# Patient Record
Sex: Female | Born: 1995 | Race: Black or African American | Hispanic: No | Marital: Single | Smoking: Never smoker
Health system: Southern US, Community
[De-identification: ages and names within clinical notes are randomized; demographics above are authoritative.]

## PROBLEM LIST (undated history)

## (undated) ENCOUNTER — Inpatient Hospital Stay (HOSPITAL_COMMUNITY): Payer: Self-pay

## (undated) DIAGNOSIS — R569 Unspecified convulsions: Secondary | ICD-10-CM

## (undated) HISTORY — PX: NO PAST SURGERIES: SHX2092

---
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
## 2017-11-24 DIAGNOSIS — H5213 Myopia, bilateral: Secondary | ICD-10-CM | POA: Diagnosis not present

## 2017-11-24 DIAGNOSIS — H52223 Regular astigmatism, bilateral: Secondary | ICD-10-CM | POA: Diagnosis not present

## 2017-12-20 DIAGNOSIS — H52223 Regular astigmatism, bilateral: Secondary | ICD-10-CM | POA: Diagnosis not present

## 2018-02-11 DIAGNOSIS — N771 Vaginitis, vulvitis and vulvovaginitis in diseases classified elsewhere: Secondary | ICD-10-CM | POA: Diagnosis not present

## 2018-02-11 DIAGNOSIS — Z113 Encounter for screening for infections with a predominantly sexual mode of transmission: Secondary | ICD-10-CM | POA: Diagnosis not present

## 2018-03-07 DIAGNOSIS — G8911 Acute pain due to trauma: Secondary | ICD-10-CM | POA: Diagnosis not present

## 2018-03-07 DIAGNOSIS — G501 Atypical facial pain: Secondary | ICD-10-CM | POA: Diagnosis not present

## 2018-03-07 DIAGNOSIS — R079 Chest pain, unspecified: Secondary | ICD-10-CM | POA: Diagnosis not present

## 2018-03-07 DIAGNOSIS — R0789 Other chest pain: Secondary | ICD-10-CM | POA: Diagnosis not present

## 2018-03-07 DIAGNOSIS — Z743 Need for continuous supervision: Secondary | ICD-10-CM | POA: Diagnosis not present

## 2018-07-13 DIAGNOSIS — Z114 Encounter for screening for human immunodeficiency virus [HIV]: Secondary | ICD-10-CM | POA: Diagnosis not present

## 2018-07-13 DIAGNOSIS — Z113 Encounter for screening for infections with a predominantly sexual mode of transmission: Secondary | ICD-10-CM | POA: Diagnosis not present

## 2018-11-27 ENCOUNTER — Emergency Department (HOSPITAL_COMMUNITY): Payer: Medicaid Other

## 2018-11-27 ENCOUNTER — Emergency Department (HOSPITAL_COMMUNITY)
Admission: EM | Admit: 2018-11-27 | Discharge: 2018-11-27 | Disposition: A | Discharge status: Home / Self Care | Payer: Medicaid Other | Attending: Emergency Medicine | Admitting: Emergency Medicine

## 2018-11-27 DIAGNOSIS — R109 Unspecified abdominal pain: Secondary | ICD-10-CM | POA: Diagnosis not present

## 2018-11-27 DIAGNOSIS — M79601 Pain in right arm: Secondary | ICD-10-CM | POA: Diagnosis not present

## 2018-11-27 DIAGNOSIS — Y92410 Unspecified street and highway as the place of occurrence of the external cause: Secondary | ICD-10-CM | POA: Insufficient documentation

## 2018-11-27 DIAGNOSIS — M25521 Pain in right elbow: Secondary | ICD-10-CM | POA: Insufficient documentation

## 2018-11-27 DIAGNOSIS — R1084 Generalized abdominal pain: Secondary | ICD-10-CM | POA: Diagnosis not present

## 2018-11-27 DIAGNOSIS — Y9389 Activity, other specified: Secondary | ICD-10-CM | POA: Insufficient documentation

## 2018-11-27 DIAGNOSIS — Y998 Other external cause status: Secondary | ICD-10-CM | POA: Diagnosis not present

## 2018-11-27 DIAGNOSIS — M25511 Pain in right shoulder: Secondary | ICD-10-CM | POA: Diagnosis not present

## 2018-11-27 DIAGNOSIS — M542 Cervicalgia: Secondary | ICD-10-CM | POA: Insufficient documentation

## 2018-11-27 LAB — I-STAT CREATININE, ED: Creatinine, Ser: 0.6 mg/dL (ref 0.44–1.00)

## 2018-11-27 LAB — I-STAT BETA HCG BLOOD, ED (MC, WL, AP ONLY): I-stat hCG, quantitative: <5 m[IU]/mL (ref <5)

## 2018-11-27 MED ORDER — NAPROXEN 500 MG PO TABS: 500.0000 mg | ORAL_TABLET | Freq: Two times a day (BID) | ORAL | 0 refills | Status: AC

## 2018-11-27 MED ORDER — HYDROCODONE-ACETAMINOPHEN 5-325 MG PO TABS
1.0000 | ORAL_TABLET | Freq: Once | ORAL | Status: AC
  Administered 2018-11-27: 1 via ORAL
  Filled 2018-11-27: qty 1

## 2018-11-27 MED ORDER — IOHEXOL 300 MG/ML  SOLN
100.0000 mL | Freq: Once | INTRAMUSCULAR | Status: AC | PRN
  Administered 2018-11-27: 100 mL via INTRAVENOUS
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
## 2018-11-27 NOTE — ED Notes (Signed)
Patient transported to CT 

## 2018-11-27 NOTE — Discharge Instructions (Addendum)
Please follow up with [facility] for your primary care needs. You may also follow up with OBGYN. Take medication as prescribed for pain.
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
## 2018-12-22 ENCOUNTER — Other Ambulatory Visit: Payer: Self-pay

## 2018-12-22 ENCOUNTER — Ambulatory Visit (INDEPENDENT_AMBULATORY_CARE_PROVIDER_SITE_OTHER): Payer: Medicaid Other | Admitting: Obstetrics

## 2018-12-22 ENCOUNTER — Encounter: Payer: Self-pay | Admitting: Obstetrics

## 2018-12-22 DIAGNOSIS — M25512 Pain in left shoulder: Secondary | ICD-10-CM
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
## 2018-12-23 ENCOUNTER — Ambulatory Visit: Payer: Medicaid Other | Admitting: Family Medicine

## 2019-01-02 ENCOUNTER — Other Ambulatory Visit: Payer: Self-pay

## 2019-01-02 ENCOUNTER — Encounter: Payer: Self-pay | Admitting: Nurse Practitioner

## 2019-01-02 ENCOUNTER — Ambulatory Visit: Payer: No Typology Code available for payment source | Attending: Nurse Practitioner | Admitting: Nurse Practitioner

## 2019-01-02 DIAGNOSIS — Z113 Encounter for screening for infections with a predominantly sexual mode of transmission: Secondary | ICD-10-CM | POA: Diagnosis not present

## 2019-01-02 DIAGNOSIS — Z130 Encounter for screening for diseases of the blood and blood-forming organs and certain disorders involving the immune mechanism: Secondary | ICD-10-CM

## 2019-01-02 DIAGNOSIS — Z7689 Persons encountering health services in other specified circumstances: Secondary | ICD-10-CM

## 2019-01-02 DIAGNOSIS — E871 Hypo-osmolality and hyponatremia: Secondary | ICD-10-CM | POA: Diagnosis not present

## 2019-01-02 NOTE — Progress Notes (Signed)
Virtual Visit via Telephone Note Due to national recommendations of social distancing due to COVID 19, telehealth visit is felt to be most appropriate for this patient at this time.  I discussed the limitations, risks, security and privacy concerns of performing an evaluation and management service by telephone and the availability of in person appointments. I also discussed with the patient that there may be a patient responsible charge related to this service. The patient expressed understanding and agreed to proceed.    I connected with Katie Maxwell on 01/02/19  at   1:50 PM EDT  EDT by telephone and verified that I am speaking with the correct person using two identifiers.   Consent I discussed the limitations, risks, security and privacy concerns of performing an evaluation and management service by telephone and the availability of in person appointments. I also discussed with the patient that there may be a patient responsible charge related to this service. The patient expressed understanding and agreed to proceed.   Location of Patient: Private Residence   [facility]    Persons participating in Telemedicine visit: Bertram DenverZelda  FNP-BC YY Bien CMA Brystol Lajeunesse    History of Present Illness: Telemedicine visit for: Establish care   She is establishing care. She is sexually active.  Has a 21 year old child. Pap smear is overdue. She would also like testing for STD. WIll schedule PAP and blood work. She voices no concerns today.  Although a history of seizures listed for PMH she states she does not recall any seizures from her childhood. There is a hospital admission which notes patient was evaluated for seizure like activity with recommendations by Neurology that patient had syncopal seizure however EEG was never confirmed or completed.  Past Medical History:  Diagnosis Date  . Seizures ([facility])    last seizure in 2015; had  them as a child    Past Surgical History:  Procedure Laterality Date  . NO PAST SURGERIES      Family History  Problem Relation Age of Onset  . Bronchiolitis Mother   . Seizures Maternal Grandmother   . Hypertension Maternal Grandmother   . Seizures Cousin   . Diabetes Father     Social History   Socioeconomic History  . Marital status: Single    Spouse name: Not on file  . Number of children: Not on file  . Years of education: Not on file  . Highest education level: Not on file  Occupational History  . Not on file  Social Needs  . Financial resource strain: Not on file  . Food insecurity    Worry: Not on file    Inability: Not on file  . Transportation needs    Medical: Not on file    Non-medical: Not on file  Tobacco Use  . Smoking status: Never Smoker  . Smokeless tobacco: Never Used  Substance and Sexual Activity  . Alcohol use: No  . Drug use: No  . Sexual activity: Yes    Partners: Male    Birth control/protection: Implant  Lifestyle  . Physical activity    Days per week: Not on file    Minutes per session: Not on file  . Stress: Not on file  Relationships  . Social Musicianconnections    Talks on phone: Not on file    Gets together: Not on file    Attends religious service: Not on file    Active member of club or  organization: Not on file    Attends meetings of clubs or organizations: Not on file    Relationship status: Not on file  Other Topics Concern  . Not on file  Social History Narrative  . Not on file     Observations/Objective: Awake, alert and oriented x 3   Review of Systems  Constitutional: Negative for fever, malaise/fatigue and weight loss.  HENT: Negative.  Negative for nosebleeds.   Eyes: Negative.  Negative for blurred vision, double vision and photophobia.  Respiratory: Negative.  Negative for cough and shortness of breath.   Cardiovascular: Negative.  Negative for chest pain, palpitations and leg swelling.  Gastrointestinal:  Negative.  Negative for heartburn, nausea and vomiting.  Musculoskeletal: Negative.  Negative for myalgias.  Neurological: Negative.  Negative for dizziness, focal weakness, seizures and headaches.  Psychiatric/Behavioral: Negative.  Negative for suicidal ideas.    Assessment and Plan: Diagnoses and all orders for this visit:  Encounter to establish care  Screening for deficiency anemia -     CBC; Future  Hyponatremia -     Basic Metabolic Panel; Future    Follow Up Instructions Return for PAP AND LABS.     I discussed the assessment and treatment plan with the patient. The patient was provided an opportunity to ask questions and all were answered. The patient agreed with the plan and demonstrated an understanding of the instructions.   The patient was advised to call back or seek an in-person evaluation if the symptoms worsen or if the condition fails to improve as anticipated.  I provided 18 minutes of non-face-to-face time during this encounter including median intraservice time, reviewing previous notes, labs, imaging, medications and explaining diagnosis and management.  Gildardo Pounds, FNP-BC

## 2019-01-03 ENCOUNTER — Encounter: Payer: Self-pay | Admitting: Nurse Practitioner

## 2019-01-16 ENCOUNTER — Other Ambulatory Visit: Payer: Medicaid Other | Admitting: Nurse Practitioner

## 2019-02-03 DIAGNOSIS — Z20828 Contact with and (suspected) exposure to other viral communicable diseases: Secondary | ICD-10-CM | POA: Diagnosis not present

## 2019-02-13 ENCOUNTER — Encounter

## 2019-06-27 ENCOUNTER — Ambulatory Visit: Payer: Medicaid Other | Admitting: Obstetrics and Gynecology

## 2019-06-27 ENCOUNTER — Other Ambulatory Visit: Payer: Self-pay

## 2019-06-27 ENCOUNTER — Other Ambulatory Visit (HOSPITAL_COMMUNITY)
Admission: RE | Admit: 2019-06-27 | Discharge: 2019-06-27 | Disposition: A | Discharge status: Home / Self Care | Payer: Medicaid Other | Source: Ambulatory Visit | Attending: Obstetrics and Gynecology | Admitting: Obstetrics and Gynecology

## 2019-06-27 ENCOUNTER — Encounter: Payer: Self-pay | Admitting: Obstetrics and Gynecology

## 2019-06-27 VITALS — BP 114/75 | HR 93 | Wt 100.0 lb

## 2019-06-27 DIAGNOSIS — Z113 Encounter for screening for infections with a predominantly sexual mode of transmission: Secondary | ICD-10-CM | POA: Insufficient documentation

## 2019-06-27 DIAGNOSIS — Z30013 Encounter for initial prescription of injectable contraceptive: Secondary | ICD-10-CM | POA: Diagnosis not present

## 2019-06-27 DIAGNOSIS — Z3046 Encounter for surveillance of implantable subdermal contraceptive: Secondary | ICD-10-CM | POA: Diagnosis not present

## 2019-06-27 MED ORDER — MEDROXYPROGESTERONE ACETATE 150 MG/ML IM SUSP
150.0000 mg | Freq: Once | INTRAMUSCULAR | Status: AC
  Administered 2019-06-27: 150 mg via INTRAMUSCULAR

## 2019-06-27 MED ORDER — MEDROXYPROGESTERONE ACETATE 150 MG/ML IM SUSP: 150.0000 mg | INTRAMUSCULAR | 5 refills | Status: DC

## 2019-06-27 NOTE — Progress Notes (Addendum)
Depo injection given at today's visit. Pt received office supplied Depo today. Pt tolerated injection well. Pt advised to RTO 3/30-4/13/2021 for next depo.  Administrations This Visit    medroxyPROGESTERone (DEPO-PROVERA) injection 150 mg    Admin Date 06/27/2019 Action Given Dose 150 mg Route Intramuscular Administered By Lanney Gins, CMA

## 2019-06-27 NOTE — Progress Notes (Signed)
24 yo P1 here for Nexplanon removal (in place since 05/2016) with irregular bleeding. Contraception options reviewed and patient opted for depo-provera   Removal Patient given informed consent for removal of her Implanon, time out was performed.  Signed copy in the chart.  Appropriate time out taken. Nexplanon site identified.  Area prepped in usual sterile fashon. One cc of 1% lidocaine was used to anesthetize the area at the distal end of the implant. A small stab incision was made right beside the implant on the distal portion.  The Nexplanon rod was grasped using hemostats and removed without difficulty.  There was less than 3 cc blood loss. There were no complications.  A small amount of antibiotic ointment and steri-strips were applied over the small incision.  A pressure bandage was applied to reduce any bruising.  The patient tolerated the procedure well and was given post procedure instructions.  Patient will return for annual exam with pap smear

## 2019-06-27 NOTE — Progress Notes (Signed)
Pt states she has prolong irregular bleeding with Nexplanon and would like to switch methods. Pt unsure what method she would like to try.

## 2019-06-27 NOTE — Addendum Note (Signed)
Addended by: Marya Landry D on: 06/27/2019 04:01 PM   Modules accepted: Orders

## 2019-06-28 LAB — CERVICOVAGINAL ANCILLARY ONLY
Chlamydia: NEGATIVE
Comment: NEGATIVE
Comment: NORMAL
Neisseria Gonorrhea: NEGATIVE

## 2019-06-28 LAB — HIV ANTIBODY (ROUTINE TESTING W REFLEX): HIV Screen 4th Generation wRfx: NONREACTIVE

## 2019-06-28 LAB — RPR: RPR Ser Ql: NONREACTIVE

## 2019-06-28 LAB — HEPATITIS B SURFACE ANTIGEN: Hepatitis B Surface Ag: NEGATIVE

## 2019-06-28 LAB — HEPATITIS C ANTIBODY: Hep C Virus Ab: 0.1 s/co ratio (ref 0.0–0.9)

## 2019-07-11 DIAGNOSIS — Z20828 Contact with and (suspected) exposure to other viral communicable diseases: Secondary | ICD-10-CM | POA: Diagnosis not present

## 2019-09-29 ENCOUNTER — Other Ambulatory Visit: Payer: Self-pay

## 2019-09-29 ENCOUNTER — Ambulatory Visit (INDEPENDENT_AMBULATORY_CARE_PROVIDER_SITE_OTHER): Payer: Medicaid Other

## 2019-09-29 DIAGNOSIS — Z3042 Encounter for surveillance of injectable contraceptive: Secondary | ICD-10-CM | POA: Diagnosis not present

## 2019-09-29 MED ORDER — MEDROXYPROGESTERONE ACETATE 150 MG/ML IM SUSP
150.0000 mg | Freq: Once | INTRAMUSCULAR | Status: AC
  Administered 2019-09-29: 150 mg via INTRAMUSCULAR

## 2019-09-29 NOTE — Progress Notes (Signed)
Patient seen and assessed by nursing staff during this encounter. I have reviewed the chart and agree with the documentation and plan. I have also made any necessary editorial changes.  Shaune Malacara, MD 09/29/2019 11:14 AM    

## 2019-09-29 NOTE — Progress Notes (Signed)
Katie Maxwell is here for depo. Pt tolerated injection well in the LD without complication.  -EH/RMA

## 2019-12-19 ENCOUNTER — Other Ambulatory Visit: Payer: Self-pay

## 2019-12-19 ENCOUNTER — Encounter: Payer: Self-pay | Admitting: Obstetrics

## 2019-12-19 ENCOUNTER — Ambulatory Visit (INDEPENDENT_AMBULATORY_CARE_PROVIDER_SITE_OTHER): Payer: Medicaid Other

## 2019-12-19 VITALS — BP 102/68 | HR 75 | Wt 105.0 lb

## 2019-12-19 DIAGNOSIS — Z3042 Encounter for surveillance of injectable contraceptive: Secondary | ICD-10-CM

## 2019-12-19 MED ORDER — MEDROXYPROGESTERONE ACETATE 150 MG/ML IM SUSP
150.0000 mg | Freq: Once | INTRAMUSCULAR | Status: AC
  Administered 2019-12-19: 150 mg via INTRAMUSCULAR

## 2019-12-19 NOTE — Progress Notes (Signed)
Presents for DEPO, given in RD, tolerated well.  Next DEPO Sept. 21 - Oct. 5, 2021  Administrations This Visit    medroxyPROGESTERone (DEPO-PROVERA) injection 150 mg    Admin Date 12/19/2019 Action Given Dose 150 mg Route Intramuscular Administered By Maretta Bees, RMA

## 2019-12-19 NOTE — Progress Notes (Signed)
Chart reviewed for nurse visit. Agree with plan of care.   Nakhi Choi L, DO 12/19/2019 2:45 PM

## 2020-03-05 ENCOUNTER — Encounter: Payer: Self-pay | Admitting: Obstetrics

## 2020-03-05 ENCOUNTER — Other Ambulatory Visit (HOSPITAL_COMMUNITY)
Admission: RE | Admit: 2020-03-05 | Discharge: 2020-03-05 | Disposition: A | Discharge status: Home / Self Care | Payer: BC Managed Care – PPO | Source: Ambulatory Visit | Attending: Obstetrics and Gynecology | Admitting: Obstetrics and Gynecology

## 2020-03-05 ENCOUNTER — Ambulatory Visit (INDEPENDENT_AMBULATORY_CARE_PROVIDER_SITE_OTHER): Payer: Medicaid Other | Admitting: *Deleted

## 2020-03-05 ENCOUNTER — Other Ambulatory Visit: Payer: Self-pay

## 2020-03-05 VITALS — BP 110/75 | HR 73

## 2020-03-05 DIAGNOSIS — Z113 Encounter for screening for infections with a predominantly sexual mode of transmission: Secondary | ICD-10-CM | POA: Diagnosis not present

## 2020-03-05 DIAGNOSIS — Z3042 Encounter for surveillance of injectable contraceptive: Secondary | ICD-10-CM

## 2020-03-05 DIAGNOSIS — N939 Abnormal uterine and vaginal bleeding, unspecified: Secondary | ICD-10-CM | POA: Diagnosis present

## 2020-03-05 MED ORDER — MEDROXYPROGESTERONE ACETATE 150 MG/ML IM SUSP
150.0000 mg | Freq: Once | INTRAMUSCULAR | Status: AC
  Administered 2020-03-05: 150 mg via INTRAMUSCULAR

## 2020-03-05 NOTE — Progress Notes (Signed)
Pt is in office for Depo injection. Pt is on time for injection.  Depo given, pt tolerated well.  Advised to RTO 12/7-21 for next Depo.   Pt states she has had some irregular spotting the last 1-2 weeks.  Pt request to have STD screen today. Pt request full panel swab. Self swab performed and sent to lab today.  Pt will be made aware of any abnormal results.  BP 110/75   Pulse 73   Administrations This Visit    medroxyPROGESTERone (DEPO-PROVERA) injection 150 mg    Admin Date 03/05/2020 Action Given Dose 150 mg Route Intramuscular Administered By Lanney Gins, CMA

## 2020-03-06 NOTE — Progress Notes (Signed)
Patient was assessed and managed by nursing staff during this encounter. I have reviewed the chart and agree with the documentation and plan. I have also made any necessary editorial changes.  Ronnica Dreese, MD 03/06/2020 10:56 AM    

## 2020-03-07 LAB — CERVICOVAGINAL ANCILLARY ONLY
Bacterial Vaginitis (gardnerella): POSITIVE — AB
Candida Glabrata: NEGATIVE
Candida Vaginitis: NEGATIVE
Chlamydia: NEGATIVE
Comment: NEGATIVE
Comment: NEGATIVE
Comment: NEGATIVE
Comment: NEGATIVE
Comment: NEGATIVE
Comment: NORMAL
Neisseria Gonorrhea: NEGATIVE
Trichomonas: NEGATIVE

## 2020-03-07 MED ORDER — METRONIDAZOLE 500 MG PO TABS: 500.0000 mg | ORAL_TABLET | Freq: Two times a day (BID) | ORAL | 0 refills | Status: AC

## 2020-03-07 NOTE — Addendum Note (Signed)
Addended by: Catalina Antigua on: 03/07/2020 04:07 PM   Modules accepted: Orders

## 2020-05-28 ENCOUNTER — Ambulatory Visit: Payer: Medicaid Other

## 2020-06-24 DIAGNOSIS — M25531 Pain in right wrist: Secondary | ICD-10-CM | POA: Diagnosis not present

## 2020-07-29 ENCOUNTER — Other Ambulatory Visit: Payer: Self-pay

## 2020-07-29 ENCOUNTER — Encounter: Payer: Self-pay | Admitting: Advanced Practice Midwife

## 2020-07-29 ENCOUNTER — Ambulatory Visit (INDEPENDENT_AMBULATORY_CARE_PROVIDER_SITE_OTHER): Payer: Medicaid Other | Admitting: Advanced Practice Midwife

## 2020-07-29 ENCOUNTER — Other Ambulatory Visit (HOSPITAL_COMMUNITY)
Admission: RE | Admit: 2020-07-29 | Discharge: 2020-07-29 | Disposition: A | Discharge status: Home / Self Care | Payer: Medicaid Other | Source: Ambulatory Visit | Attending: Advanced Practice Midwife | Admitting: Advanced Practice Midwife

## 2020-07-29 VITALS — BP 117/63 | HR 83 | Ht 59.0 in | Wt 105.0 lb

## 2020-07-29 DIAGNOSIS — Z113 Encounter for screening for infections with a predominantly sexual mode of transmission: Secondary | ICD-10-CM | POA: Insufficient documentation

## 2020-07-29 DIAGNOSIS — R109 Unspecified abdominal pain: Secondary | ICD-10-CM | POA: Diagnosis not present

## 2020-07-29 DIAGNOSIS — Z01419 Encounter for gynecological examination (general) (routine) without abnormal findings: Secondary | ICD-10-CM

## 2020-07-29 DIAGNOSIS — Z124 Encounter for screening for malignant neoplasm of cervix: Secondary | ICD-10-CM

## 2020-07-29 DIAGNOSIS — N939 Abnormal uterine and vaginal bleeding, unspecified: Secondary | ICD-10-CM

## 2020-07-29 DIAGNOSIS — N61 Mastitis without abscess: Secondary | ICD-10-CM | POA: Diagnosis not present

## 2020-07-29 DIAGNOSIS — Z3009 Encounter for other general counseling and advice on contraception: Secondary | ICD-10-CM

## 2020-07-29 DIAGNOSIS — R102 Pelvic and perineal pain: Secondary | ICD-10-CM | POA: Diagnosis not present

## 2020-07-29 DIAGNOSIS — S21039A Puncture wound without foreign body of unspecified breast, initial encounter: Secondary | ICD-10-CM

## 2020-07-29 DIAGNOSIS — Z3042 Encounter for surveillance of injectable contraceptive: Secondary | ICD-10-CM | POA: Insufficient documentation

## 2020-07-29 MED ORDER — XULANE 150-35 MCG/24HR TD PTWK: 1.0000 | MEDICATED_PATCH | TRANSDERMAL | 12 refills | Status: AC

## 2020-07-29 MED ORDER — SULFAMETHOXAZOLE-TRIMETHOPRIM 800-160 MG PO TABS: 1.0000 | ORAL_TABLET | Freq: Two times a day (BID) | ORAL | 0 refills | Status: AC

## 2020-07-29 NOTE — Progress Notes (Signed)
Subjective:     Katie Maxwell is a 25 y.o. female here at [facility] for a routine exam.  Current complaints: None.  Personal health questionnaire reviewed: yes.  Do you have a primary care provider? yds Do you feel safe at home? yes  Flowsheet Row Office Visit from 07/29/2020 in [facility]  PHQ-2 Total Score 0      Risk factors for chronic health problems: Smoking: Never Alchohol/how much: None Pt BMI: Body mass index is 21.21 kg/m.   Gynecologic History No LMP recorded (lmp unknown). (Menstrual status: Other). Contraception: condoms Last Pap: No hx.  Last mammogram: n/a.   Obstetric History OB History  Gravida Para Term Preterm AB Living  1 1 0 1 0 1  SAB IAB Ectopic Multiple Live Births  0 0 0 0 1    # Outcome Date GA Lbr Len/2nd Weight Sex Delivery Anes PTL Lv  1 Preterm 06/06/16 [redacted]w[redacted]d 05:30 / 00:04 5 lb 9.2 oz (2.53 kg) M Vag-Spont None  LIV     The following portions of the patient's history were reviewed and updated as appropriate: allergies, current medications, past family history, past medical history, past social history, past surgical history and problem list.  Review of Systems Pertinent items noted in HPI and remainder of comprehensive ROS otherwise negative.    Objective:   BP 117/63   Pulse 83   Ht 4\' 11"  (1.499 m)   Wt 105 lb (47.6 kg)   LMP  (LMP Unknown)   BMI 21.21 kg/m  VS reviewed, nursing note reviewed,  Constitutional: well developed, well nourished, no distress HEENT: normocephalic CV: normal rate Pulm/chest wall: normal effort Breast Exam:  Deferred with low risks and shared decision making, discussed recommendation to start mammogram between 40-50 yo/ exam Abdomen: soft Neuro: alert and oriented x 3 Skin: warm, dry Psych: affect normal Pelvic exam: Performed: Cervix pink, visually closed, without lesion, scant white creamy discharge, vaginal walls and external genitalia normal Bimanual exam: Cervix  0/long/high, firm, anterior, neg CMT, uterus nontender, nonenlarged, adnexa without tenderness, enlargement, or mass       Assessment/Plan:   1. Screen for STD (sexually transmitted disease)  - Cervicovaginal ancillary only( [facility]) - RPR+HBsAg+HCVAb+...  2. Screening for cervical cancer  - Cytology - PAP( [facility])  3. Well woman exam   4. Abdominal pain in female --See pelvic pain  5. Abnormal uterine bleeding (AUB) --Pt with hx irregular menses prior to contraception.  Last Depo injection in October, only 1 menses, light spotting only since stopping Depo. - November PELVIC COMPLETE WITH TRANSVAGINAL; Future  6. Acute pelvic pain, female --Pain in lower abdomen and pelvis x 1 month, but hx of milder pain x 1-2 years.  Pt with hx abnormal/irregular menses. - US PELVIC COMPLETE WITH TRANSVAGINAL; Future  7. Pierced nipple infection --Left nipple with edema, errythema of nipple, areola wnl, no fever/chills - sulfamethoxazole-trimethoprim (BACTRIM DS) 800-160 MG tablet; Take 1 tablet by mouth 2 (two) times daily for 7 days.  Dispense: 14 tablet; Refill: 0  8. Encounter for general counseling and advice on contraceptive management Discussed LARCs as most effective forms of birth control.  Discussed benefits/risks of other methods.  Pt has hx irregular menses and desires regular period. Cycles may help pt pain so pt desires to try.  Discussed failure rates of OCPs with regular use. Reviewed risk factors and s/sx of DVT/PE/reasons to seek medical care.   --F/U in 3 months - norelgestromin-ethinyl  estradiol [employer]) 150-35 MCG/24HR transdermal patch; Place 1 patch onto the skin once a week.  Dispense: 3 patch; Refill: 12  Follow up in: 3 months or as needed.   Sharen Counter, CNM 2:07 PM

## 2020-07-29 NOTE — Patient Instructions (Signed)
Pelvic Pain, Female Pelvic pain is pain in your lower abdomen, below your belly button and between your hips. The pain may start suddenly (be acute), keep coming back (be recurring), or last a long time (become chronic). Pelvic pain that lasts longer than 6 months is considered chronic. Pelvic pain may affect your:  Reproductive organs.  Urinary system.  Digestive tract.  Musculoskeletal system. There are many potential causes of pelvic pain. Sometimes, the pain can be a result of digestive or urinary conditions, strained muscles or ligaments, or reproductive conditions. Sometimes the cause of pelvic pain is not known. Follow these instructions at home:  Take over-the-counter and prescription medicines only as told by your health care provider.  Rest as told by your health care provider.  Do not have sex if it hurts.  Keep a journal of your pelvic pain. Write down: ? When the pain started. ? Where the pain is located. ? What seems to make the pain better or worse, such as food or your period (menstrual cycle). ? Any symptoms you have along with the pain.  Keep all follow-up visits as told by your health care provider. This is important.   Contact a health care provider if:  Medicine does not help your pain.  Your pain comes back.  You have new symptoms.  You have abnormal vaginal discharge or bleeding, including bleeding after menopause.  You have a fever or chills.  You are constipated.  You have blood in your urine or stool.  You have foul-smelling urine.  You feel weak or light-headed. Get help right away if:  You have sudden severe pain.  Your pain gets steadily worse.  You have severe pain along with fever, nausea, vomiting, or excessive sweating.  You lose consciousness. Summary  Pelvic pain is pain in your lower abdomen, below your belly button and between your hips.  There are many potential causes of pelvic pain.  Keep a journal of your pelvic  pain. This information is not intended to replace advice given to you by your health care provider. Make sure you discuss any questions you have with your health care provider. Document Revised: 11/17/2017 Document Reviewed: 11/17/2017 Elsevier Patient Education  2021 Elsevier Inc.  Hormonal Contraception Information Hormonal contraception is a type of birth control that uses hormones to prevent pregnancy. It usually involves a combination of the hormones estrogen and progesterone, or only the hormone progesterone. Hormonal contraception works in these ways:  It thickens the mucus in the cervix, which is the lowest part of the uterus. Thicker mucus makes it harder for sperm to enter the uterus.  It changes the lining of the uterus. This makes it harder for an egg to attach or implant.  It may stop the ovaries from releasing eggs (ovulation). Some women who take hormonal contraceptives that contain only progesterone may continue to ovulate. Hormonal contraception cannot prevent STIs (sexually transmitted infections). Pregnancy may still occur. Types of hormonal contraception Estrogen and progesterone contraceptives Contraceptives that use a combination of estrogen and progesterone are available in these forms:  Pill. Pills come in different combinations of hormones. Pills must be taken at the same time each day. They can affect your period. You can get your period monthly, once every 3 months, or not at all.  Patch. The patch is applied to the buttocks, abdomen, upper outer arm, or back. It is kept in place for 3 weeks. It is removed for the last or fourth week of the cycle.  Vaginal  ring. The ring is placed in the vagina and left there for 3 weeks. It is then removed for the last or fourth week of the cycle.   Progesterone-only contraceptives Contraceptives that use only progesterone are available in these forms:  Pill. Pills should be taken at the same time everyday. This is very  important to decrease the chance of pregnancy. Pills containing progestin-only are usually taken every day of the cycle. Other types of pills may have a placebo tablet for the last 4 days of every cycle.  Intrauterine device (IUD). This device is inserted through the vagina and cervix into the uterus. It is removed or replaced every 3 to 5 years, depending on the type. It can be removed sooner.  Implant. Plastic rods are placed under the skin of the upper arm. They are removed or replaced every 3 years. They can be removed sooner.  Shot (injection). The injection is given once every 12 or 13 weeks (about 3 months).   Risks associated with hormonal contraception Estrogen and progesterone contraceptives can sometimes cause side effects, such as:  Nausea.  Headaches.  Breast tenderness.  Bleeding or spotting between menstrual cycles.  High blood pressure (rare).  Strokes, heart attacks, or blood clots (rare). Progesterone-only contraceptives also can have side effects, such as:  Nausea.  Headaches.  Breast tenderness.  Irregular menstrual bleeding.  High blood pressure (rare). Talk to your health care provider about what side effects may mean for you. Questions to ask:  What type of hormonal contraception is right for me?  How long should I plan to use hormonal contraception?  What are the side effects of the hormonal contraception method I choose?  How can I prevent STIs while using hormonal contraception? Where to find more information Ask your health care provider for more information and resources about hormonal contraception. You can also go to:  [place] Department of Health and [employer], Office on Women's Health: http://hoffman.com/ Summary  Estrogen and progesterone are hormones used in many forms of birth control.  Hormonal contraception cannot prevent STIs (sexually transmitted infections).  Talk to your health care provider about what side effects may  mean for you.  Ask your health care provider for more information and resources about hormonal contraception. This information is not intended to replace advice given to you by your health care provider. Make sure you discuss any questions you have with your health care provider. Document Revised: 02/07/2020 Document Reviewed: 02/07/2020 Elsevier Patient Education  2021 [employer].

## 2020-07-29 NOTE — Progress Notes (Signed)
GYN presents for AEX/STD screening.  C/o abdominal pain 6/10 x 1+ month.

## 2020-07-30 LAB — CERVICOVAGINAL ANCILLARY ONLY
Chlamydia: NEGATIVE
Comment: NEGATIVE
Comment: NEGATIVE
Comment: NORMAL
Neisseria Gonorrhea: NEGATIVE
Trichomonas: NEGATIVE

## 2020-07-30 LAB — RPR+HBSAG+HCVAB+...
HIV Screen 4th Generation wRfx: NONREACTIVE
Hep C Virus Ab: 0.1 s/co ratio (ref 0.0–0.9)
Hepatitis B Surface Ag: NEGATIVE
RPR Ser Ql: NONREACTIVE

## 2020-08-02 LAB — CYTOLOGY - PAP
Comment: NEGATIVE
Diagnosis: UNDETERMINED — AB
High risk HPV: NEGATIVE

## 2020-08-05 ENCOUNTER — Encounter: Payer: Self-pay | Admitting: Advanced Practice Midwife

## 2020-08-05 DIAGNOSIS — R87610 Atypical squamous cells of undetermined significance on cytologic smear of cervix (ASC-US): Secondary | ICD-10-CM | POA: Insufficient documentation

## 2020-08-08 ENCOUNTER — Ambulatory Visit
Admission: RE | Admit: 2020-08-08 | Discharge: 2020-08-08 | Disposition: A | Discharge status: Home / Self Care | Payer: Medicaid Other | Source: Ambulatory Visit | Attending: Advanced Practice Midwife | Admitting: Advanced Practice Midwife

## 2020-08-08 ENCOUNTER — Other Ambulatory Visit: Payer: Self-pay

## 2020-08-08 DIAGNOSIS — R102 Pelvic and perineal pain: Secondary | ICD-10-CM | POA: Insufficient documentation

## 2020-08-08 DIAGNOSIS — D259 Leiomyoma of uterus, unspecified: Secondary | ICD-10-CM | POA: Diagnosis not present

## 2020-08-08 DIAGNOSIS — R188 Other ascites: Secondary | ICD-10-CM | POA: Diagnosis not present

## 2020-08-08 DIAGNOSIS — N939 Abnormal uterine and vaginal bleeding, unspecified: Secondary | ICD-10-CM | POA: Diagnosis not present

## 2020-10-24 ENCOUNTER — Emergency Department (HOSPITAL_COMMUNITY)
Admission: EM | Admit: 2020-10-24 | Discharge: 2020-10-25 | Disposition: A | Discharge status: Home / Self Care | Payer: BC Managed Care – PPO | Attending: Emergency Medicine | Admitting: Emergency Medicine

## 2020-10-24 ENCOUNTER — Other Ambulatory Visit: Payer: Self-pay

## 2020-10-24 DIAGNOSIS — N939 Abnormal uterine and vaginal bleeding, unspecified: Secondary | ICD-10-CM | POA: Diagnosis not present

## 2020-10-24 DIAGNOSIS — Z5321 Procedure and treatment not carried out due to patient leaving prior to being seen by health care provider: Secondary | ICD-10-CM | POA: Insufficient documentation

## 2020-10-24 NOTE — ED Provider Notes (Signed)
  Emergency Medicine Provider in Triage Note   MSE was initiated and I personally evaluated the patient  11:28 PM on Oct 24, 2020 as provider in triage.   Chief Complaint: Vaginal bleeding  HPI  Patient is a 25 y.o. who presents to the ED with complaints of vaginal bleeding x 3-4 days. Patient reports waxing/waning vaginal bleeding, passing clots and mucous at times which she states is atypical for her, having associated lower abdominal cramps. Hx of irregular periods. Sexually active w/ 1 partner w/o protection or OCP.    Review of Systems  Positive: Vaginal bleeding, pelvic cramping Negative: Vomiting, dysuria, syncope  Physical Exam  BP 115/67 (BP Location: Right Arm)   Pulse 78   Temp 98.6 F (37 C) (Oral)   Resp 16   SpO2 100%    Gen:   Awake, no distress   HEENT:  Atraumatic  Resp:  Normal effort  Cardiac:  Normal rate  Abd:   Nondistended, nontender  MSK:   Moves extremities without difficulty  Neuro:  Speech clear   Medical Decision Making   Initiation of care has begun. The patient has been counseled on the process, plan, and necessity for staying for the completion/evaluation, informed that the remainder of the evaluation will be completed by another provider, this initial triage assessment does not replace that evaluation, and the importance of remaining in the ED until their evaluation is complete.  Clinical Impression  Vaginal bleeding.         Desmond Lope 10/24/20 2330    Nira Conn, MD 10/28/20 2106

## 2020-10-24 NOTE — ED Triage Notes (Signed)
Pt having irregular vaginal bleeding. Blood has mucous within blood. Has abdominal cramping x 4 days. Pt denies nausea/vomiting.

## 2020-10-25 LAB — CBC WITH DIFFERENTIAL/PLATELET
Abs Immature Granulocytes: 0.01 10*3/uL (ref 0.00–0.07)
Basophils Absolute: 0 10*3/uL (ref 0.0–0.1)
Basophils Relative: 0 %
Eosinophils Absolute: 0.2 10*3/uL (ref 0.0–0.5)
Eosinophils Relative: 5 %
HCT: 37.3 % (ref 36.0–46.0)
Hemoglobin: 12.4 g/dL (ref 12.0–15.0)
Immature Granulocytes: 0 %
Lymphocytes Relative: 51 %
Lymphs Abs: 1.9 10*3/uL (ref 0.7–4.0)
MCH: 29.9 pg (ref 26.0–34.0)
MCHC: 33.2 g/dL (ref 30.0–36.0)
MCV: 89.9 fL (ref 80.0–100.0)
Monocytes Absolute: 0.3 10*3/uL (ref 0.1–1.0)
Monocytes Relative: 7 %
Neutro Abs: 1.4 10*3/uL — ABNORMAL LOW (ref 1.7–7.7)
Neutrophils Relative %: 37 %
Platelets: 192 10*3/uL (ref 150–400)
RBC: 4.15 MIL/uL (ref 3.87–5.11)
RDW: 11.4 % — ABNORMAL LOW (ref 11.5–15.5)
WBC: 3.8 10*3/uL — ABNORMAL LOW (ref 4.0–10.5)
nRBC: 0 % (ref 0.0–0.2)

## 2020-10-25 LAB — URINALYSIS, ROUTINE W REFLEX MICROSCOPIC
Bilirubin Urine: NEGATIVE
Glucose, UA: NEGATIVE mg/dL
Hgb urine dipstick: NEGATIVE
Ketones, ur: NEGATIVE mg/dL
Leukocytes,Ua: NEGATIVE
Nitrite: NEGATIVE
Protein, ur: NEGATIVE mg/dL
Specific Gravity, Urine: 1.03 (ref 1.005–1.030)
pH: 7 (ref 5.0–8.0)

## 2020-10-25 LAB — COMPREHENSIVE METABOLIC PANEL
ALT: 28 U/L (ref 0–44)
AST: 29 U/L (ref 15–41)
Albumin: 3.9 g/dL (ref 3.5–5.0)
Alkaline Phosphatase: 47 U/L (ref 38–126)
Anion gap: 6 (ref 5–15)
BUN: 16 mg/dL (ref 6–20)
CO2: 25 mmol/L (ref 22–32)
Calcium: 9 mg/dL (ref 8.9–10.3)
Chloride: 106 mmol/L (ref 98–111)
Creatinine, Ser: 0.68 mg/dL (ref 0.44–1.00)
GFR, Estimated: >60 mL/min (ref >60)
Glucose, Bld: 91 mg/dL (ref 70–99)
Potassium: 4.1 mmol/L (ref 3.5–5.1)
Sodium: 137 mmol/L (ref 135–145)
Total Bilirubin: 0.6 mg/dL (ref 0.3–1.2)
Total Protein: 7.2 g/dL (ref 6.5–8.1)

## 2020-10-25 LAB — HCG, QUANTITATIVE, PREGNANCY: hCG, Beta Chain, Quant, S: 4 m[IU]/mL (ref <5)

## 2020-10-25 NOTE — ED Notes (Signed)
Pt called to be roomed, no response 

## 2020-10-28 ENCOUNTER — Telehealth: Payer: Self-pay

## 2020-10-28 NOTE — Telephone Encounter (Signed)
Transition Care Management Follow-up Telephone Call  Date of discharge and from where: 10/26/2020 from [facility]  How have you been since you were released from the hospital? Pt stated that she is feeling well. Pt did not have any questions or concerns at this time.   Any questions or concerns? No  Items Reviewed:  Did the pt receive and understand the discharge instructions provided? Yes   Medications obtained and verified? Yes   Other? No   Any new allergies since your discharge? No   Dietary orders reviewed? n/a  Do you have support at home? Yes   Functional Questionnaire: (I = Independent and D = Dependent) ADLs: I  Bathing/Dressing- I  Meal Prep- I  Eating- I  Maintaining continence- I  Transferring/Ambulation- I  Managing Meds- I   Follow up appointments reviewed:   PCP Hospital f/u appt confirmed? No    Specialist Hospital f/u appt confirmed? No    Are transportation arrangements needed? No   If their condition worsens, is the pt aware to call PCP or go to the Emergency Dept.? Yes  Was the patient provided with contact information for the PCP's office or ED? Yes  Was to pt encouraged to call back with questions or concerns? Yes

## 2021-03-10 ENCOUNTER — Other Ambulatory Visit (HOSPITAL_COMMUNITY)
Admission: RE | Admit: 2021-03-10 | Discharge: 2021-03-10 | Disposition: A | Discharge status: Home / Self Care | Payer: Medicaid Other | Source: Ambulatory Visit | Attending: Obstetrics and Gynecology | Admitting: Obstetrics and Gynecology

## 2021-03-10 ENCOUNTER — Other Ambulatory Visit: Payer: Self-pay

## 2021-03-10 ENCOUNTER — Ambulatory Visit (INDEPENDENT_AMBULATORY_CARE_PROVIDER_SITE_OTHER): Payer: Medicaid Other

## 2021-03-10 DIAGNOSIS — N898 Other specified noninflammatory disorders of vagina: Secondary | ICD-10-CM

## 2021-03-10 DIAGNOSIS — Z113 Encounter for screening for infections with a predominantly sexual mode of transmission: Secondary | ICD-10-CM

## 2021-03-10 NOTE — Progress Notes (Signed)
Patient was assessed and managed by nursing staff during this encounter. I have reviewed the chart and agree with the documentation and plan. I have also made any necessary editorial changes.  Catalina Antigua, MD 03/10/2021 12:50 PM

## 2021-03-10 NOTE — Progress Notes (Signed)
SUBJECTIVE:  25 y.o. female complains of white vaginal discharge and requests all STD testing.  Denies abnormal vaginal bleeding or significant pelvic pain or fever. No UTI symptoms. Denies history of known exposure to STD.   OBJECTIVE:  She appears well, afebrile. Urine dipstick: not done.  ASSESSMENT:  Vaginal Discharge     PLAN:  GC, chlamydia, trichomonas, BVAG, CVAG probe sent to lab. Treatment: To be determined once lab results are reviewed by the provider  ROV prn if symptoms persist or worsen.

## 2021-03-11 LAB — CERVICOVAGINAL ANCILLARY ONLY
Bacterial Vaginitis (gardnerella): POSITIVE — AB
Candida Glabrata: NEGATIVE
Candida Vaginitis: POSITIVE — AB
Chlamydia: NEGATIVE
Comment: NEGATIVE
Comment: NEGATIVE
Comment: NEGATIVE
Comment: NEGATIVE
Comment: NEGATIVE
Comment: NORMAL
Neisseria Gonorrhea: NEGATIVE
Trichomonas: NEGATIVE

## 2021-03-11 LAB — RPR: RPR Ser Ql: NONREACTIVE

## 2021-03-11 LAB — HIV ANTIBODY (ROUTINE TESTING W REFLEX): HIV Screen 4th Generation wRfx: NONREACTIVE

## 2021-03-11 LAB — HEPATITIS B SURFACE ANTIGEN: Hepatitis B Surface Ag: NEGATIVE

## 2021-03-11 LAB — HEPATITIS C ANTIBODY: Hep C Virus Ab: <0.1 s/co ratio (ref 0.0–0.9)

## 2021-03-11 MED ORDER — METRONIDAZOLE 500 MG PO TABS: 500.0000 mg | ORAL_TABLET | Freq: Two times a day (BID) | ORAL | 0 refills | Status: AC

## 2021-03-11 MED ORDER — FLUCONAZOLE 150 MG PO TABS: 150.0000 mg | ORAL_TABLET | Freq: Once | ORAL | 0 refills | Status: AC

## 2021-03-11 NOTE — Addendum Note (Signed)
Addended by: Catalina Antigua on: 03/11/2021 05:59 PM   Modules accepted: Orders

## 2021-04-15 DIAGNOSIS — Z3481 Encounter for supervision of other normal pregnancy, first trimester: Secondary | ICD-10-CM | POA: Diagnosis not present

## 2021-04-15 DIAGNOSIS — O09211 Supervision of pregnancy with history of pre-term labor, first trimester: Secondary | ICD-10-CM | POA: Diagnosis not present

## 2021-04-15 DIAGNOSIS — Z3201 Encounter for pregnancy test, result positive: Secondary | ICD-10-CM | POA: Diagnosis not present

## 2021-04-15 DIAGNOSIS — Z130 Encounter for screening for diseases of the blood and blood-forming organs and certain disorders involving the immune mechanism: Secondary | ICD-10-CM | POA: Diagnosis not present

## 2021-04-15 DIAGNOSIS — Z113 Encounter for screening for infections with a predominantly sexual mode of transmission: Secondary | ICD-10-CM | POA: Diagnosis not present

## 2021-04-15 DIAGNOSIS — Z3A01 Less than 8 weeks gestation of pregnancy: Secondary | ICD-10-CM | POA: Diagnosis not present

## 2021-04-29 DIAGNOSIS — O3680X1 Pregnancy with inconclusive fetal viability, fetus 1: Secondary | ICD-10-CM | POA: Diagnosis not present

## 2021-04-29 DIAGNOSIS — Z113 Encounter for screening for infections with a predominantly sexual mode of transmission: Secondary | ICD-10-CM | POA: Diagnosis not present

## 2021-05-27 DIAGNOSIS — Z3A13 13 weeks gestation of pregnancy: Secondary | ICD-10-CM | POA: Diagnosis not present

## 2021-05-27 DIAGNOSIS — Z1379 Encounter for other screening for genetic and chromosomal anomalies: Secondary | ICD-10-CM | POA: Diagnosis not present

## 2021-06-24 DIAGNOSIS — Z1379 Encounter for other screening for genetic and chromosomal anomalies: Secondary | ICD-10-CM | POA: Diagnosis not present

## 2021-06-24 DIAGNOSIS — Z3482 Encounter for supervision of other normal pregnancy, second trimester: Secondary | ICD-10-CM | POA: Diagnosis not present

## 2021-07-15 DIAGNOSIS — Z3689 Encounter for other specified antenatal screening: Secondary | ICD-10-CM | POA: Diagnosis not present

## 2021-07-17 DIAGNOSIS — O09212 Supervision of pregnancy with history of pre-term labor, second trimester: Secondary | ICD-10-CM | POA: Diagnosis not present

## 2021-07-21 DIAGNOSIS — M545 Low back pain, unspecified: Secondary | ICD-10-CM | POA: Diagnosis not present

## 2021-07-21 DIAGNOSIS — G43009 Migraine without aura, not intractable, without status migrainosus: Secondary | ICD-10-CM | POA: Diagnosis not present

## 2021-07-21 DIAGNOSIS — R0602 Shortness of breath: Secondary | ICD-10-CM | POA: Diagnosis not present

## 2021-07-24 DIAGNOSIS — O09212 Supervision of pregnancy with history of pre-term labor, second trimester: Secondary | ICD-10-CM | POA: Diagnosis not present

## 2021-08-07 DIAGNOSIS — O09212 Supervision of pregnancy with history of pre-term labor, second trimester: Secondary | ICD-10-CM | POA: Diagnosis not present

## 2021-08-14 DIAGNOSIS — O09212 Supervision of pregnancy with history of pre-term labor, second trimester: Secondary | ICD-10-CM | POA: Diagnosis not present

## 2021-08-21 DIAGNOSIS — O09212 Supervision of pregnancy with history of pre-term labor, second trimester: Secondary | ICD-10-CM | POA: Diagnosis not present

## 2021-08-28 DIAGNOSIS — O09212 Supervision of pregnancy with history of pre-term labor, second trimester: Secondary | ICD-10-CM | POA: Diagnosis not present

## 2021-09-04 IMAGING — US US PELVIS COMPLETE WITH TRANSVAGINAL
1 series · 15 of 25 positions shown · non-contrast
Comparison: None

CLINICAL DATA: Abnormal uterine bleeding, pelvic pain, G1P1



[Series 1: us pelvis complete with transvaginal · 15 of 112 slices shown]
[im 1/112]
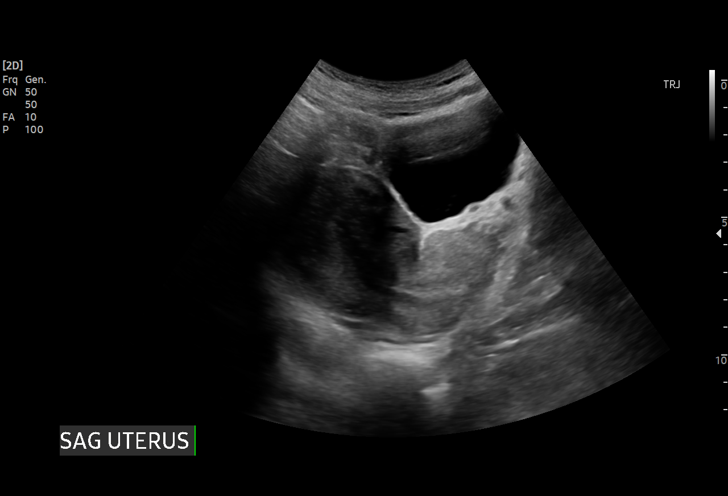
[im 10/112]
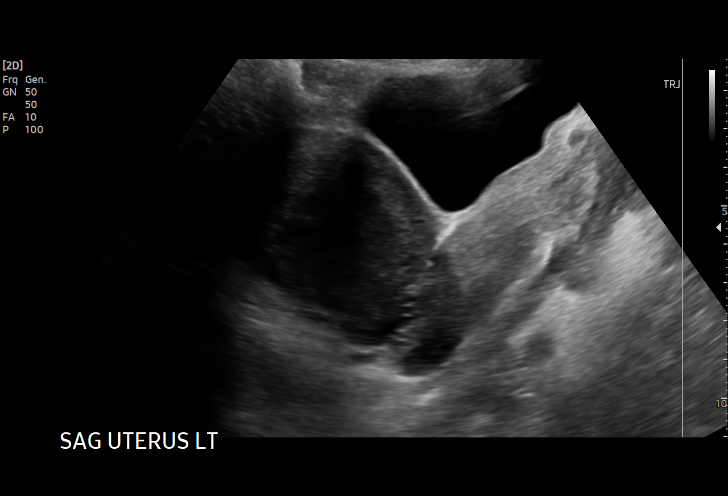
[im 19/112]
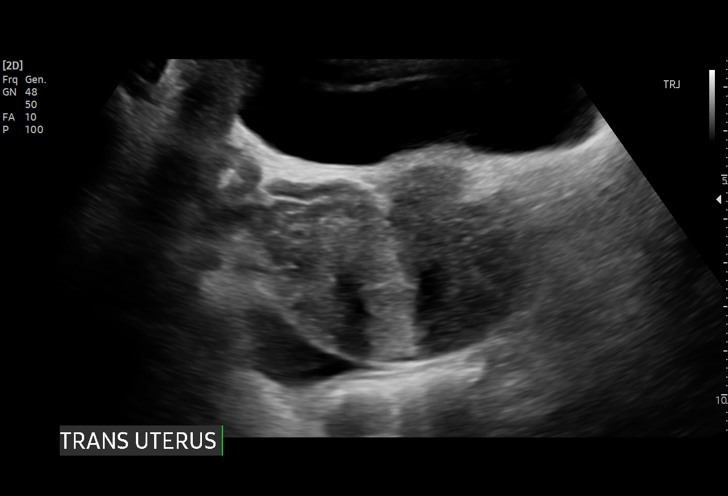
[im 24/112]
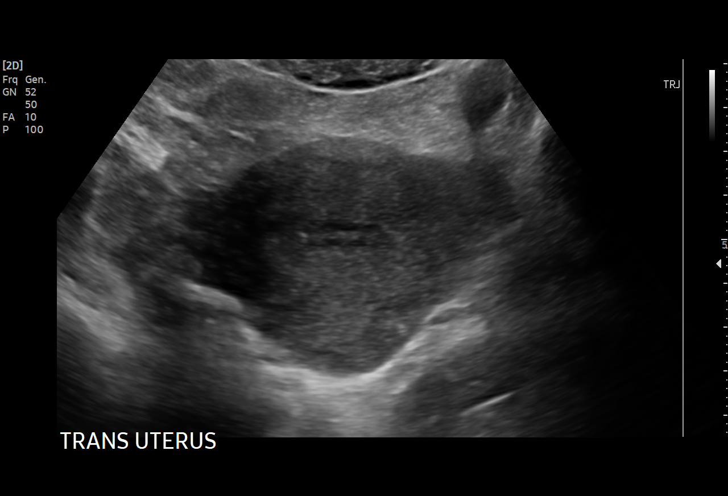
[im 33/112]
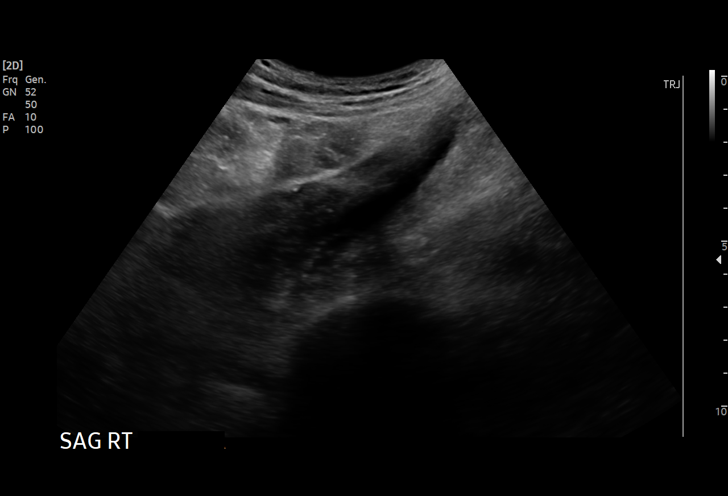
[im 42/112]
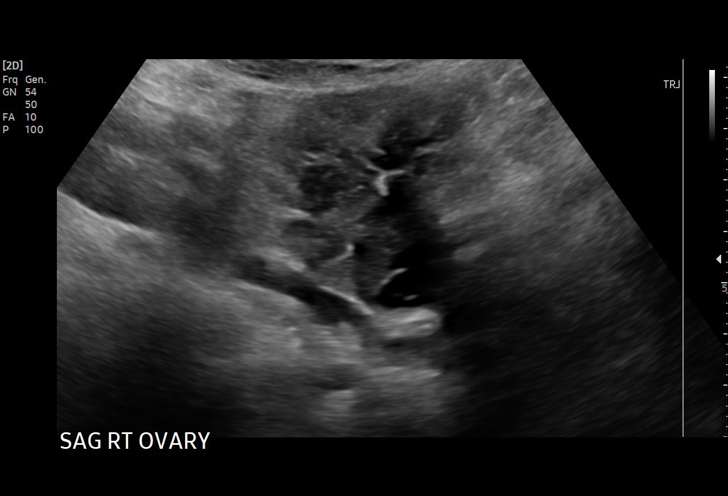
[im 47/112]
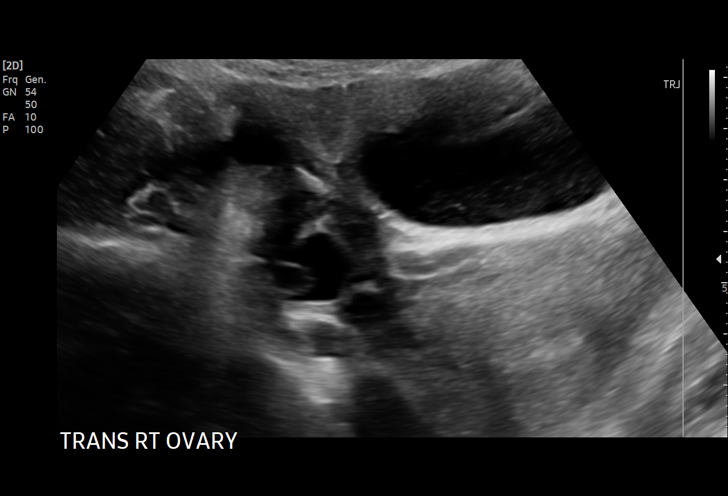
[im 56/112]
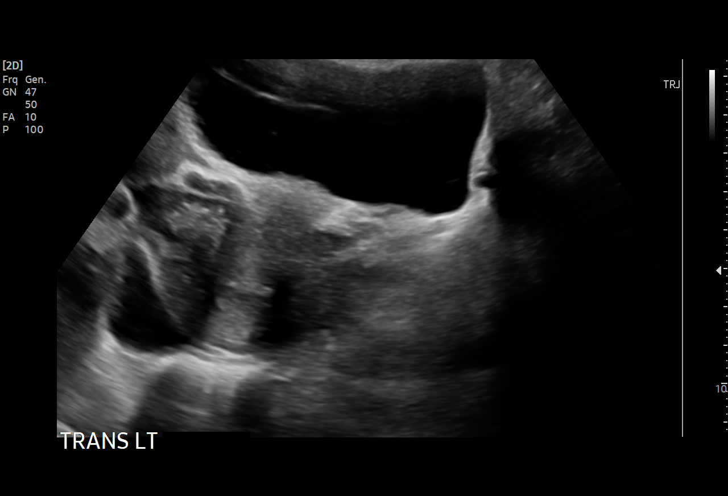
[im 65/112]
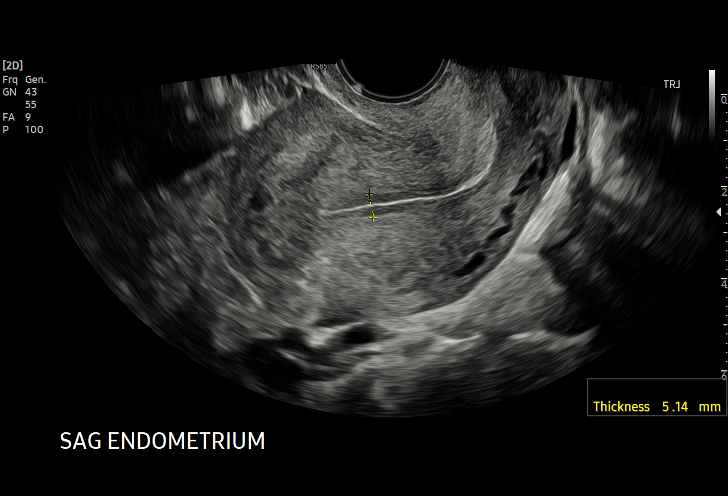
[im 70/112]
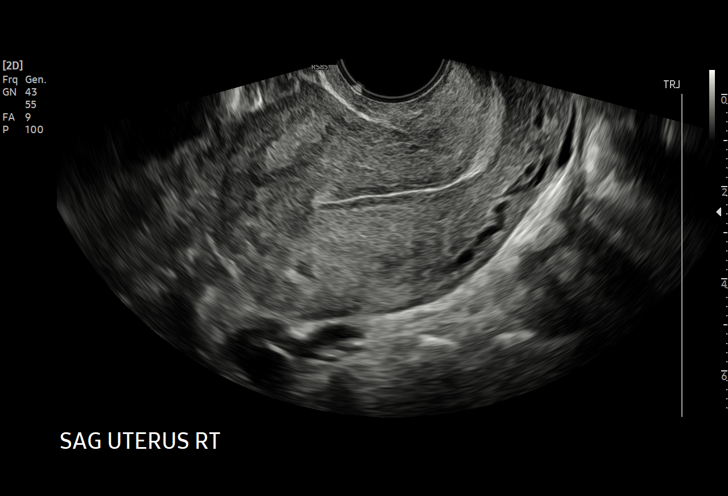
[im 79/112]
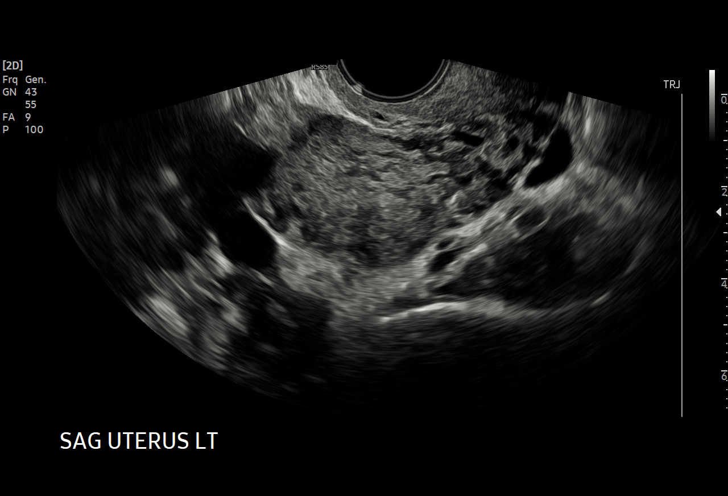
[im 88/112]
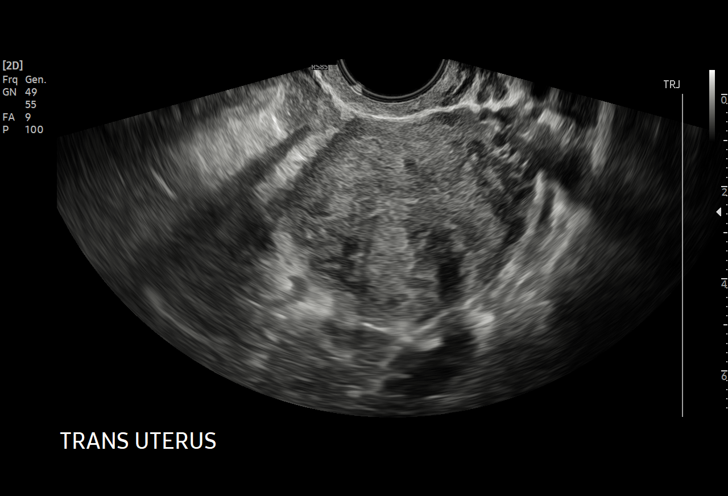
[im 93/112]
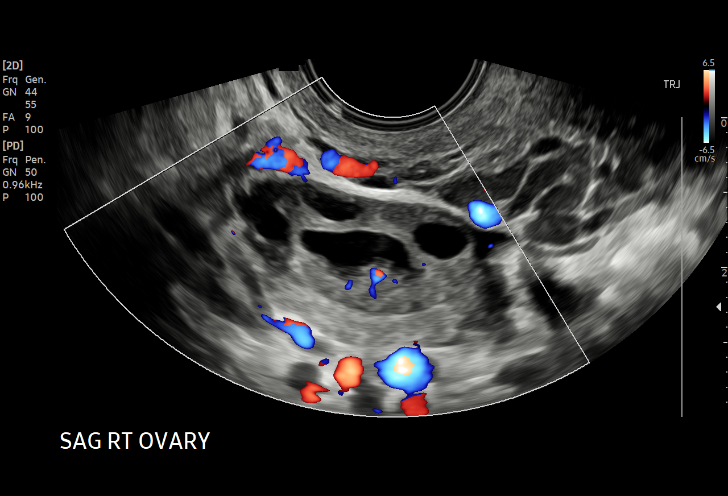
[im 102/112]
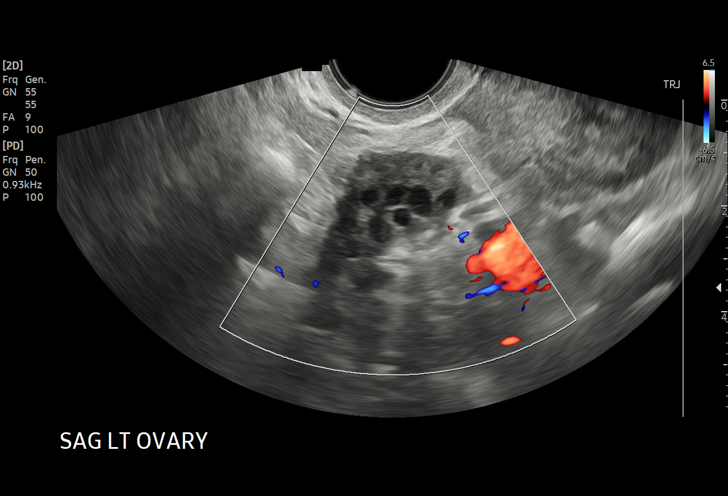
[im 112/112]
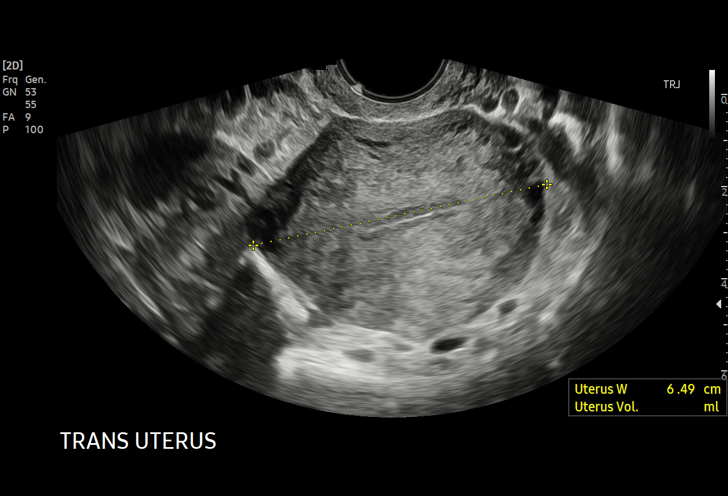

[15 of 25 positions shown; findings below may reference images not displayed]

FINDINGS: Uterus

Measurements: 8.4 x 5.4 x 6.5 cm = volume: 155.2 mL. Hypoechoic
x 1.5 x 1.4 cm focus in the right posterior uterine fundus. No other
focal uterine mass or lesion.

Endometrium

Thickness: 5 mm. Trilaminar appearance, compatible with a
proliferative phase. No focal abnormalities seen.

Right ovary

Measurements: 3.3 x 1.5 x 2.7 cm = volume: 7 mL. Normal follicles.
No concerning adnexal mass.

Left ovary

Measurements: 3.0 x 1.9 x 1.7 cm = volume: 5.1 mL. Normal follicles.
No concerning adnexal mass.

Other findings

Small amount of anechoic free fluid is seen in the posterior
cul-de-sac.
IMPRESSION: 1. 1.5 cm hypoechoic focus in the right posterior uterine fundus may
reflect a uterine fibroid.
2. Small amount of free fluid in the pelvis is nonspecific but often
physiologic in this reproductive age female.
3. Otherwise unremarkable pelvic ultrasound. If bleeding remains
unresponsive to hormonal or medical therapy, sonohysterogram should
be considered for focal lesion work-up. (Ref: Radiological
Reasoning: Algorithmic Workup of Abnormal Vaginal Bleeding with
Endovaginal Sonography and Sonohysterography. AJR [id number]; [id number])

## 2021-09-18 DIAGNOSIS — O09213 Supervision of pregnancy with history of pre-term labor, third trimester: Secondary | ICD-10-CM | POA: Diagnosis not present

## 2021-10-11 DIAGNOSIS — O09213 Supervision of pregnancy with history of pre-term labor, third trimester: Secondary | ICD-10-CM | POA: Diagnosis not present

## 2021-10-11 DIAGNOSIS — Z3A32 32 weeks gestation of pregnancy: Secondary | ICD-10-CM | POA: Diagnosis not present

## 2021-10-11 DIAGNOSIS — O99113 Other diseases of the blood and blood-forming organs and certain disorders involving the immune mechanism complicating pregnancy, third trimester: Secondary | ICD-10-CM | POA: Diagnosis not present

## 2021-10-14 DIAGNOSIS — O26843 Uterine size-date discrepancy, third trimester: Secondary | ICD-10-CM | POA: Diagnosis not present

## 2021-10-14 DIAGNOSIS — Z23 Encounter for immunization: Secondary | ICD-10-CM | POA: Diagnosis not present

## 2021-10-14 DIAGNOSIS — Z3A33 33 weeks gestation of pregnancy: Secondary | ICD-10-CM | POA: Diagnosis not present

## 2021-10-14 DIAGNOSIS — Z3A31 31 weeks gestation of pregnancy: Secondary | ICD-10-CM | POA: Diagnosis not present

## 2021-10-14 DIAGNOSIS — O99113 Other diseases of the blood and blood-forming organs and certain disorders involving the immune mechanism complicating pregnancy, third trimester: Secondary | ICD-10-CM | POA: Diagnosis not present

## 2021-10-15 DIAGNOSIS — Z3A33 33 weeks gestation of pregnancy: Secondary | ICD-10-CM | POA: Diagnosis not present

## 2021-10-16 DIAGNOSIS — Z3A33 33 weeks gestation of pregnancy: Secondary | ICD-10-CM | POA: Diagnosis not present

## 2021-11-25 DIAGNOSIS — F321 Major depressive disorder, single episode, moderate: Secondary | ICD-10-CM | POA: Diagnosis not present

## 2021-11-26 DIAGNOSIS — Z130 Encounter for screening for diseases of the blood and blood-forming organs and certain disorders involving the immune mechanism: Secondary | ICD-10-CM | POA: Diagnosis not present

## 2021-11-26 DIAGNOSIS — Z3202 Encounter for pregnancy test, result negative: Secondary | ICD-10-CM | POA: Diagnosis not present

## 2022-01-07 DIAGNOSIS — F321 Major depressive disorder, single episode, moderate: Secondary | ICD-10-CM | POA: Diagnosis not present

## 2022-01-08 DIAGNOSIS — Z3009 Encounter for other general counseling and advice on contraception: Secondary | ICD-10-CM | POA: Diagnosis not present

## 2022-01-08 DIAGNOSIS — F53 Postpartum depression: Secondary | ICD-10-CM | POA: Diagnosis not present

## 2022-01-09 DIAGNOSIS — Z30430 Encounter for insertion of intrauterine contraceptive device: Secondary | ICD-10-CM | POA: Diagnosis not present

## 2022-01-09 DIAGNOSIS — Z3202 Encounter for pregnancy test, result negative: Secondary | ICD-10-CM | POA: Diagnosis not present

## 2022-01-20 DIAGNOSIS — F321 Major depressive disorder, single episode, moderate: Secondary | ICD-10-CM | POA: Diagnosis not present

## 2022-01-24 DIAGNOSIS — F321 Major depressive disorder, single episode, moderate: Secondary | ICD-10-CM | POA: Diagnosis not present

## 2022-01-27 DIAGNOSIS — F321 Major depressive disorder, single episode, moderate: Secondary | ICD-10-CM | POA: Diagnosis not present

## 2022-01-29 DIAGNOSIS — F321 Major depressive disorder, single episode, moderate: Secondary | ICD-10-CM | POA: Diagnosis not present

## 2022-02-02 DIAGNOSIS — F321 Major depressive disorder, single episode, moderate: Secondary | ICD-10-CM | POA: Diagnosis not present

## 2022-02-04 DIAGNOSIS — F321 Major depressive disorder, single episode, moderate: Secondary | ICD-10-CM | POA: Diagnosis not present

## 2022-02-09 DIAGNOSIS — F321 Major depressive disorder, single episode, moderate: Secondary | ICD-10-CM | POA: Diagnosis not present

## 2022-02-10 DIAGNOSIS — Z30431 Encounter for routine checking of intrauterine contraceptive device: Secondary | ICD-10-CM | POA: Diagnosis not present

## 2022-02-12 DIAGNOSIS — F321 Major depressive disorder, single episode, moderate: Secondary | ICD-10-CM | POA: Diagnosis not present

## 2022-02-16 DIAGNOSIS — F321 Major depressive disorder, single episode, moderate: Secondary | ICD-10-CM | POA: Diagnosis not present

## 2022-02-23 DIAGNOSIS — F321 Major depressive disorder, single episode, moderate: Secondary | ICD-10-CM | POA: Diagnosis not present

## 2022-02-27 DIAGNOSIS — F321 Major depressive disorder, single episode, moderate: Secondary | ICD-10-CM | POA: Diagnosis not present

## 2022-03-02 DIAGNOSIS — F321 Major depressive disorder, single episode, moderate: Secondary | ICD-10-CM | POA: Diagnosis not present

## 2022-03-06 DIAGNOSIS — F321 Major depressive disorder, single episode, moderate: Secondary | ICD-10-CM | POA: Diagnosis not present

## 2022-03-09 DIAGNOSIS — F321 Major depressive disorder, single episode, moderate: Secondary | ICD-10-CM | POA: Diagnosis not present

## 2022-03-12 DIAGNOSIS — F321 Major depressive disorder, single episode, moderate: Secondary | ICD-10-CM | POA: Diagnosis not present

## 2022-03-15 DIAGNOSIS — F321 Major depressive disorder, single episode, moderate: Secondary | ICD-10-CM | POA: Diagnosis not present

## 2022-03-18 DIAGNOSIS — F321 Major depressive disorder, single episode, moderate: Secondary | ICD-10-CM | POA: Diagnosis not present

## 2022-03-22 DIAGNOSIS — F321 Major depressive disorder, single episode, moderate: Secondary | ICD-10-CM | POA: Diagnosis not present

## 2022-03-27 DIAGNOSIS — F321 Major depressive disorder, single episode, moderate: Secondary | ICD-10-CM | POA: Diagnosis not present

## 2022-03-30 DIAGNOSIS — F321 Major depressive disorder, single episode, moderate: Secondary | ICD-10-CM | POA: Diagnosis not present

## 2022-04-02 DIAGNOSIS — F321 Major depressive disorder, single episode, moderate: Secondary | ICD-10-CM | POA: Diagnosis not present

## 2022-04-06 DIAGNOSIS — F321 Major depressive disorder, single episode, moderate: Secondary | ICD-10-CM | POA: Diagnosis not present

## 2022-04-10 DIAGNOSIS — F321 Major depressive disorder, single episode, moderate: Secondary | ICD-10-CM | POA: Diagnosis not present

## 2022-04-13 DIAGNOSIS — F321 Major depressive disorder, single episode, moderate: Secondary | ICD-10-CM | POA: Diagnosis not present

## 2022-04-16 DIAGNOSIS — F321 Major depressive disorder, single episode, moderate: Secondary | ICD-10-CM | POA: Diagnosis not present

## 2022-04-20 DIAGNOSIS — F321 Major depressive disorder, single episode, moderate: Secondary | ICD-10-CM | POA: Diagnosis not present

## 2022-04-25 DIAGNOSIS — F321 Major depressive disorder, single episode, moderate: Secondary | ICD-10-CM | POA: Diagnosis not present

## 2022-04-28 DIAGNOSIS — F321 Major depressive disorder, single episode, moderate: Secondary | ICD-10-CM | POA: Diagnosis not present

## 2022-05-01 DIAGNOSIS — F321 Major depressive disorder, single episode, moderate: Secondary | ICD-10-CM | POA: Diagnosis not present

## 2022-05-03 DIAGNOSIS — F321 Major depressive disorder, single episode, moderate: Secondary | ICD-10-CM | POA: Diagnosis not present

## 2022-05-06 DIAGNOSIS — F321 Major depressive disorder, single episode, moderate: Secondary | ICD-10-CM | POA: Diagnosis not present

## 2022-05-10 DIAGNOSIS — F321 Major depressive disorder, single episode, moderate: Secondary | ICD-10-CM | POA: Diagnosis not present

## 2022-05-13 DIAGNOSIS — F321 Major depressive disorder, single episode, moderate: Secondary | ICD-10-CM | POA: Diagnosis not present

## 2022-05-18 DIAGNOSIS — F321 Major depressive disorder, single episode, moderate: Secondary | ICD-10-CM | POA: Diagnosis not present

## 2022-05-18 DIAGNOSIS — Z3009 Encounter for other general counseling and advice on contraception: Secondary | ICD-10-CM | POA: Diagnosis not present

## 2022-05-18 DIAGNOSIS — Z3202 Encounter for pregnancy test, result negative: Secondary | ICD-10-CM | POA: Diagnosis not present

## 2022-05-21 DIAGNOSIS — F321 Major depressive disorder, single episode, moderate: Secondary | ICD-10-CM | POA: Diagnosis not present

## 2022-05-24 DIAGNOSIS — F321 Major depressive disorder, single episode, moderate: Secondary | ICD-10-CM | POA: Diagnosis not present

## 2022-05-25 DIAGNOSIS — Z30432 Encounter for removal of intrauterine contraceptive device: Secondary | ICD-10-CM | POA: Diagnosis not present

## 2022-05-25 DIAGNOSIS — Z3202 Encounter for pregnancy test, result negative: Secondary | ICD-10-CM | POA: Diagnosis not present

## 2022-05-28 DIAGNOSIS — F321 Major depressive disorder, single episode, moderate: Secondary | ICD-10-CM | POA: Diagnosis not present

## 2022-05-31 DIAGNOSIS — F321 Major depressive disorder, single episode, moderate: Secondary | ICD-10-CM | POA: Diagnosis not present

## 2022-06-03 DIAGNOSIS — F321 Major depressive disorder, single episode, moderate: Secondary | ICD-10-CM | POA: Diagnosis not present

## 2022-06-09 DIAGNOSIS — F321 Major depressive disorder, single episode, moderate: Secondary | ICD-10-CM | POA: Diagnosis not present

## 2022-06-12 DIAGNOSIS — F321 Major depressive disorder, single episode, moderate: Secondary | ICD-10-CM | POA: Diagnosis not present

## 2022-06-16 DIAGNOSIS — F321 Major depressive disorder, single episode, moderate: Secondary | ICD-10-CM | POA: Diagnosis not present

## 2022-06-19 DIAGNOSIS — F321 Major depressive disorder, single episode, moderate: Secondary | ICD-10-CM | POA: Diagnosis not present

## 2022-06-22 DIAGNOSIS — F321 Major depressive disorder, single episode, moderate: Secondary | ICD-10-CM | POA: Diagnosis not present

## 2022-06-25 DIAGNOSIS — F321 Major depressive disorder, single episode, moderate: Secondary | ICD-10-CM | POA: Diagnosis not present

## 2022-06-29 DIAGNOSIS — F321 Major depressive disorder, single episode, moderate: Secondary | ICD-10-CM | POA: Diagnosis not present

## 2022-07-02 DIAGNOSIS — F321 Major depressive disorder, single episode, moderate: Secondary | ICD-10-CM | POA: Diagnosis not present

## 2022-07-07 DIAGNOSIS — Z113 Encounter for screening for infections with a predominantly sexual mode of transmission: Secondary | ICD-10-CM | POA: Diagnosis not present

## 2022-08-03 DIAGNOSIS — F321 Major depressive disorder, single episode, moderate: Secondary | ICD-10-CM | POA: Diagnosis not present

## 2022-08-05 DIAGNOSIS — F321 Major depressive disorder, single episode, moderate: Secondary | ICD-10-CM | POA: Diagnosis not present

## 2022-08-14 DIAGNOSIS — F321 Major depressive disorder, single episode, moderate: Secondary | ICD-10-CM | POA: Diagnosis not present

## 2022-08-18 DIAGNOSIS — F321 Major depressive disorder, single episode, moderate: Secondary | ICD-10-CM | POA: Diagnosis not present

## 2022-08-25 DIAGNOSIS — F321 Major depressive disorder, single episode, moderate: Secondary | ICD-10-CM | POA: Diagnosis not present

## 2022-09-08 DIAGNOSIS — F321 Major depressive disorder, single episode, moderate: Secondary | ICD-10-CM | POA: Diagnosis not present

## 2023-06-06 DIAGNOSIS — N3090 Cystitis, unspecified without hematuria: Secondary | ICD-10-CM | POA: Diagnosis not present

## 2023-06-06 DIAGNOSIS — O2311 Infections of bladder in pregnancy, first trimester: Secondary | ICD-10-CM | POA: Diagnosis not present

## 2023-06-06 DIAGNOSIS — O26891 Other specified pregnancy related conditions, first trimester: Secondary | ICD-10-CM | POA: Diagnosis not present

## 2023-06-06 DIAGNOSIS — R109 Unspecified abdominal pain: Secondary | ICD-10-CM | POA: Diagnosis not present

## 2023-06-06 DIAGNOSIS — R102 Pelvic and perineal pain: Secondary | ICD-10-CM | POA: Diagnosis not present

## 2023-06-06 DIAGNOSIS — Z5321 Procedure and treatment not carried out due to patient leaving prior to being seen by health care provider: Secondary | ICD-10-CM | POA: Diagnosis not present

## 2023-06-06 DIAGNOSIS — Z3A09 9 weeks gestation of pregnancy: Secondary | ICD-10-CM | POA: Diagnosis not present

## 2023-07-13 DIAGNOSIS — Z3490 Encounter for supervision of normal pregnancy, unspecified, unspecified trimester: Secondary | ICD-10-CM | POA: Diagnosis not present

## 2023-07-13 DIAGNOSIS — Z3201 Encounter for pregnancy test, result positive: Secondary | ICD-10-CM | POA: Diagnosis not present

## 2023-08-03 DIAGNOSIS — Z3480 Encounter for supervision of other normal pregnancy, unspecified trimester: Secondary | ICD-10-CM | POA: Diagnosis not present

## 2023-08-03 DIAGNOSIS — R87610 Atypical squamous cells of undetermined significance on cytologic smear of cervix (ASC-US): Secondary | ICD-10-CM | POA: Diagnosis not present

## 2023-08-17 DIAGNOSIS — Z3689 Encounter for other specified antenatal screening: Secondary | ICD-10-CM | POA: Diagnosis not present

## 2023-09-14 DIAGNOSIS — Z3686 Encounter for antenatal screening for cervical length: Secondary | ICD-10-CM | POA: Diagnosis not present

## 2023-09-14 DIAGNOSIS — O09899 Supervision of other high risk pregnancies, unspecified trimester: Secondary | ICD-10-CM | POA: Diagnosis not present

## 2023-09-22 DIAGNOSIS — N930 Postcoital and contact bleeding: Secondary | ICD-10-CM | POA: Diagnosis not present

## 2023-09-22 DIAGNOSIS — O99891 Other specified diseases and conditions complicating pregnancy: Secondary | ICD-10-CM | POA: Diagnosis not present

## 2023-09-22 DIAGNOSIS — O4702 False labor before 37 completed weeks of gestation, second trimester: Secondary | ICD-10-CM | POA: Diagnosis not present

## 2023-09-22 DIAGNOSIS — Z3A24 24 weeks gestation of pregnancy: Secondary | ICD-10-CM | POA: Diagnosis not present

## 2023-09-22 DIAGNOSIS — O4692 Antepartum hemorrhage, unspecified, second trimester: Secondary | ICD-10-CM | POA: Diagnosis not present

## 2023-09-22 DIAGNOSIS — Z8759 Personal history of other complications of pregnancy, childbirth and the puerperium: Secondary | ICD-10-CM | POA: Diagnosis not present

## 2023-10-12 DIAGNOSIS — Z3480 Encounter for supervision of other normal pregnancy, unspecified trimester: Secondary | ICD-10-CM | POA: Diagnosis not present

## 2023-10-12 DIAGNOSIS — Z23 Encounter for immunization: Secondary | ICD-10-CM | POA: Diagnosis not present

## 2023-11-02 DIAGNOSIS — O0930 Supervision of pregnancy with insufficient antenatal care, unspecified trimester: Secondary | ICD-10-CM | POA: Diagnosis not present

## 2023-11-02 DIAGNOSIS — F53 Postpartum depression: Secondary | ICD-10-CM | POA: Diagnosis not present

## 2023-11-02 DIAGNOSIS — Z3480 Encounter for supervision of other normal pregnancy, unspecified trimester: Secondary | ICD-10-CM | POA: Diagnosis not present

## 2023-11-02 DIAGNOSIS — O09899 Supervision of other high risk pregnancies, unspecified trimester: Secondary | ICD-10-CM | POA: Diagnosis not present

## 2023-11-16 DIAGNOSIS — Z3480 Encounter for supervision of other normal pregnancy, unspecified trimester: Secondary | ICD-10-CM | POA: Diagnosis not present

## 2023-11-22 DIAGNOSIS — Z8659 Personal history of other mental and behavioral disorders: Secondary | ICD-10-CM | POA: Diagnosis not present

## 2023-11-22 DIAGNOSIS — Z8751 Personal history of pre-term labor: Secondary | ICD-10-CM | POA: Diagnosis not present

## 2023-11-22 DIAGNOSIS — N760 Acute vaginitis: Secondary | ICD-10-CM | POA: Diagnosis not present

## 2023-11-22 DIAGNOSIS — O98813 Other maternal infectious and parasitic diseases complicating pregnancy, third trimester: Secondary | ICD-10-CM | POA: Diagnosis not present

## 2023-11-22 DIAGNOSIS — O4700 False labor before 37 completed weeks of gestation, unspecified trimester: Secondary | ICD-10-CM | POA: Diagnosis not present

## 2023-11-22 DIAGNOSIS — Z3A33 33 weeks gestation of pregnancy: Secondary | ICD-10-CM | POA: Diagnosis not present

## 2023-11-24 DIAGNOSIS — O4700 False labor before 37 completed weeks of gestation, unspecified trimester: Secondary | ICD-10-CM | POA: Diagnosis not present

## 2023-11-27 DIAGNOSIS — O4703 False labor before 37 completed weeks of gestation, third trimester: Secondary | ICD-10-CM | POA: Diagnosis not present

## 2023-11-27 DIAGNOSIS — Z3A34 34 weeks gestation of pregnancy: Secondary | ICD-10-CM | POA: Diagnosis not present

## 2023-11-27 DIAGNOSIS — Z3A00 Weeks of gestation of pregnancy not specified: Secondary | ICD-10-CM | POA: Diagnosis not present

## 2023-11-27 DIAGNOSIS — O479 False labor, unspecified: Secondary | ICD-10-CM | POA: Diagnosis not present

## 2023-11-30 DIAGNOSIS — O09899 Supervision of other high risk pregnancies, unspecified trimester: Secondary | ICD-10-CM | POA: Diagnosis not present

## 2023-11-30 DIAGNOSIS — F53 Postpartum depression: Secondary | ICD-10-CM | POA: Diagnosis not present

## 2023-11-30 DIAGNOSIS — O09213 Supervision of pregnancy with history of pre-term labor, third trimester: Secondary | ICD-10-CM | POA: Diagnosis not present

## 2023-11-30 DIAGNOSIS — Z3480 Encounter for supervision of other normal pregnancy, unspecified trimester: Secondary | ICD-10-CM | POA: Diagnosis not present

## 2023-11-30 DIAGNOSIS — O4703 False labor before 37 completed weeks of gestation, third trimester: Secondary | ICD-10-CM | POA: Diagnosis not present

## 2023-11-30 DIAGNOSIS — Z87898 Personal history of other specified conditions: Secondary | ICD-10-CM | POA: Diagnosis not present

## 2023-11-30 DIAGNOSIS — O4700 False labor before 37 completed weeks of gestation, unspecified trimester: Secondary | ICD-10-CM | POA: Diagnosis not present

## 2023-12-06 DIAGNOSIS — O4700 False labor before 37 completed weeks of gestation, unspecified trimester: Secondary | ICD-10-CM | POA: Diagnosis not present

## 2023-12-06 DIAGNOSIS — Z3480 Encounter for supervision of other normal pregnancy, unspecified trimester: Secondary | ICD-10-CM | POA: Diagnosis not present

## 2023-12-13 DIAGNOSIS — Z3483 Encounter for supervision of other normal pregnancy, third trimester: Secondary | ICD-10-CM | POA: Diagnosis not present

## 2023-12-13 DIAGNOSIS — Z8659 Personal history of other mental and behavioral disorders: Secondary | ICD-10-CM | POA: Diagnosis not present

## 2023-12-13 DIAGNOSIS — Z3A36 36 weeks gestation of pregnancy: Secondary | ICD-10-CM | POA: Diagnosis not present

## 2023-12-13 DIAGNOSIS — O42913 Preterm premature rupture of membranes, unspecified as to length of time between rupture and onset of labor, third trimester: Secondary | ICD-10-CM | POA: Diagnosis not present

## 2024-01-26 DIAGNOSIS — Z30013 Encounter for initial prescription of injectable contraceptive: Secondary | ICD-10-CM | POA: Diagnosis not present

## 2024-03-01 DIAGNOSIS — Z309 Encounter for contraceptive management, unspecified: Secondary | ICD-10-CM | POA: Diagnosis not present

## 2024-03-01 DIAGNOSIS — O9102 Infection of nipple associated with the puerperium: Secondary | ICD-10-CM | POA: Diagnosis not present

## 2024-03-01 DIAGNOSIS — Z3042 Encounter for surveillance of injectable contraceptive: Secondary | ICD-10-CM | POA: Diagnosis not present

## 2024-03-01 DIAGNOSIS — B3789 Other sites of candidiasis: Secondary | ICD-10-CM | POA: Diagnosis not present
